# Patient Record
Sex: Male | Born: 2000
Health system: Southern US, Community
[De-identification: ages and names within clinical notes are randomized; demographics above are authoritative.]

## PROBLEM LIST (undated history)

## (undated) DIAGNOSIS — IMO0001 Reserved for inherently not codable concepts without codable children: Secondary | ICD-10-CM

## (undated) HISTORY — DX: Reserved for inherently not codable concepts without codable children: IMO0001

## (undated) HISTORY — PX: NO PAST SURGERIES: SHX2092

---
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
## 2019-07-17 ENCOUNTER — Ambulatory Visit (INDEPENDENT_AMBULATORY_CARE_PROVIDER_SITE_OTHER): Payer: 59 | Admitting: Physician Assistant

## 2019-07-17 ENCOUNTER — Other Ambulatory Visit: Payer: Self-pay

## 2019-07-17 ENCOUNTER — Encounter: Payer: Self-pay | Admitting: Physician Assistant

## 2019-07-17 VITALS — BP 118/76 | HR 71 | Temp 98.7°F | Resp 16 | Ht 74.0 in | Wt 181.4 lb

## 2019-07-17 DIAGNOSIS — F419 Anxiety disorder, unspecified: Secondary | ICD-10-CM | POA: Diagnosis not present

## 2019-07-17 DIAGNOSIS — F329 Major depressive disorder, single episode, unspecified: Secondary | ICD-10-CM

## 2019-07-17 DIAGNOSIS — Z0000 Encounter for general adult medical examination without abnormal findings: Secondary | ICD-10-CM

## 2019-07-17 DIAGNOSIS — F32A Depression, unspecified: Secondary | ICD-10-CM

## 2019-07-17 MED ORDER — ESCITALOPRAM OXALATE 10 MG PO TABS: 10.0000 mg | ORAL_TABLET | Freq: Every day | ORAL | 1 refills | Status: DC

## 2019-07-17 MED FILL — ESCITALOPRAM 10 MG TABLET: 10 | 30 days supply | Qty: 30 | Fill #0

## 2019-07-17 NOTE — Patient Instructions (Signed)
Please keep up with the mindfulness training and meditation.  This is a great avenue to help with stress, anxiety, focus and mood. It takes a while to notice the difference so hang in there!  I am starting you on a low-dose of Lexapro once daily to help with mood and anxiety.  Let's follow-up in 3-4 weeks via video visit to reassess how things are going.  Use the handout given to schedule a video counseling appointment.   Lean on your family, friends and girlfriend. There is nothing wrong with this.  Please schedule an appointment for fasting labs.    Preventive Care 50-66 Years Old, Male Preventive care refers to lifestyle choices and visits with your health care provider that can promote health and wellness. At this stage in your life, you may start seeing a primary care physician instead of a pediatrician. Your health care is now your responsibility. Preventive care for young adults includes:  A yearly physical exam. This is also called an annual wellness visit.  Regular dental and eye exams.  Immunizations.  Screening for certain conditions.  Healthy lifestyle choices, such as diet and exercise. What can I expect for my preventive care visit? Physical exam Your health care provider may check:  Height and weight. These may be used to calculate body mass index (BMI), which is a measurement that tells if you are at a healthy weight.  Heart rate and blood pressure.  Body temperature. Counseling Your health care provider may ask you questions about:  Past medical problems and family medical history.  Alcohol, tobacco, and drug use.  Home and relationship well-being.  Access to firearms.  Emotional well-being.  Diet, exercise, and sleep habits.  Sexual activity and sexual health. What immunizations do I need?  Influenza (flu) vaccine  This is recommended every year. Tetanus, diphtheria, and pertussis (Tdap) vaccine  You may need a Td booster every 10  years. Varicella (chickenpox) vaccine  You may need this vaccine if you have not already been vaccinated. Human papillomavirus (HPV) vaccine  If recommended by your health care provider, you may need three doses over 6 months. Measles, mumps, and rubella (MMR) vaccine  You may need at least one dose of MMR. You may also need a second dose. Meningococcal conjugate (MenACWY) vaccine  One dose is recommended if you are 61-63 years old and a Market researcher living in a residence hall, or if you have one of several medical conditions. You may also need additional booster doses. Pneumococcal conjugate (PCV13) vaccine  You may need this if you have certain conditions and were not previously vaccinated. Pneumococcal polysaccharide (PPSV23) vaccine  You may need one or two doses if you smoke cigarettes or if you have certain conditions. Hepatitis A vaccine  You may need this if you have certain conditions or if you travel or work in places where you may be exposed to hepatitis A. Hepatitis B vaccine  You may need this if you have certain conditions or if you travel or work in places where you may be exposed to hepatitis B. Haemophilus influenzae type b (Hib) vaccine  You may need this if you have certain risk factors. You may receive vaccines as individual doses or as more than one vaccine together in one shot (combination vaccines). Talk with your health care provider about the risks and benefits of combination vaccines. What tests do I need? Blood tests  Lipid and cholesterol levels. These may be checked every 5 years starting at age  20.  Hepatitis C test.  Hepatitis B test. Screening  Genital exam to check for testicular cancer or hernias.  Sexually transmitted disease (STD) testing, if you are at risk. Other tests  Tuberculosis skin test.  Vision and hearing tests.  Skin exam. Follow these instructions at home: Eating and drinking   Eat a diet that includes  fresh fruits and vegetables, whole grains, lean protein, and low-fat dairy products.  Drink enough fluid to keep your urine pale yellow.  Do not drink alcohol if: ? Your health care provider tells you not to drink. ? You are under the legal drinking age. In the [place] the legal drinking age is 34.  If you drink alcohol: ? Limit how much you have to 0-2 drinks a day. ? Be aware of how much alcohol is in your drink. In the [place] one drink equals one 12 oz bottle of beer (355 mL), one 5 oz glass of wine (148 mL), or one 1 oz glass of hard liquor (44 mL). Lifestyle  Take daily care of your teeth and gums.  Stay active. Exercise at least 30 minutes 5 or more days of the week.  Do not use any products that contain nicotine or tobacco, such as cigarettes, e-cigarettes, and chewing tobacco. If you need help quitting, ask your health care provider.  Do not use drugs.  If you are sexually active, practice safe sex. Use a condom or other form of protection to prevent STIs (sexually transmitted infections).  Find healthy ways to cope with stress, such as: ? Meditation, yoga, or listening to music. ? Journaling. ? Talking to a trusted person. ? Spending time with friends and family. Safety  Always wear your seat belt while driving or riding in a vehicle.  Do not drive if you have been drinking alcohol.  Do not ride with someone who has been drinking.  Do not drive when you are tired or distracted.  Do not text while driving.  Wear a helmet and other protective equipment during sports activities.  If you have firearms in your house, make sure you follow all gun safety procedures.  Seek help if you have been bullied, physically abused, or sexually abused.  Use the Internet responsibly to avoid dangers such as online bullying and online sex predators. What's next?  Go to your health care provider once a year for a well check visit.  Ask your health care provider how often you  should have your eyes and teeth checked.  Stay up to date on all vaccines. This information is not intended to replace advice given to you by your health care provider. Make sure you discuss any questions you have with your health care provider. Document Revised: 06/14/2018 Document Reviewed: 06/14/2018 Elsevier Patient Education  2020 [employer].

## 2019-07-17 NOTE — Progress Notes (Signed)
Patient presents to clinic today for annual exam.  Patient is not fasting for labs.  Acute Concerns: Patient endorsing issues with anxiety, depressed mood and past several months.  Initially mild but now worsening.  Notes this is associated with significant increase in stress levels.  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  Is sleeping okay at night.  Appetite stable.  Denies suicidal thought or ideation.  Mood is having a big impact on his day-to-day and ability to get things accomplished.  Has a girlfriend who he discussed his mood with.  Does feel that mom and dad would be a support system for him but does not want to "burden" them.  Has recently started reading on mindfulness for athlete's book.  Is starting to try meditation as well.  Health Maintenance: Immunizations -- Declines flu shot today.   Past Medical History:  Diagnosis Date  . Healthy adolescent     Past Surgical History:  Procedure Laterality Date  . NO PAST SURGERIES      No current outpatient medications on file prior to visit.   No current facility-administered medications on file prior to visit.    No Known Allergies  Family History  Problem Relation Age of Onset  . Hypertension Maternal Grandmother   . Hypertension Maternal Grandfather   . Cancer Paternal Grandfather        Lung  . Stroke Paternal Grandfather     Social History   Socioeconomic History  . Marital status: Single    Spouse name: Not on file  . Number of children: Not on file  . Years of education: Not on file  . Highest education level: Not on file  Occupational History  . Not on file  Tobacco Use  . Smoking status: Never Smoker  . Smokeless tobacco: Never Used  Substance and Sexual Activity  . Alcohol use: No  . Drug use: No  . Sexual activity: Yes    Partners: Female    Birth control/protection: Condom  Other Topics Concern  . Not on  file  Social History Narrative   ** Merged History Encounter **       Social Determinants of Health   Financial Resource Strain:   . Difficulty of Paying Living Expenses: Not on file  Food Insecurity:   . Worried About Programme researcher, broadcasting/film/video in the Last Year: Not on file  . Ran Out of Food in the Last Year: Not on file  Transportation Needs:   . Lack of Transportation (Medical): Not on file  . Lack of Transportation (Non-Medical): Not on file  Physical Activity:   . Days of Exercise per Week: Not on file  . Minutes of Exercise per Session: Not on file  Stress:   . Feeling of Stress : Not on file  Social Connections:   . Frequency of Communication with Friends and Family: Not on file  . Frequency of Social Gatherings with Friends and Family: Not on file  . Attends Religious Services: Not on file  . Active Member of Clubs or Organizations: Not on file  . Attends Banker Meetings: Not on file  . Marital Status: Not on file  Intimate Partner Violence:   . Fear of Current or Ex-Partner: Not on file  . Emotionally Abused: Not on file  . Physically Abused: Not on file  . Sexually Abused: Not on file   Review of Systems  Constitutional: Negative for fever and weight loss.  HENT: Negative for ear discharge, ear pain, hearing loss and tinnitus.   Eyes: Negative for blurred vision, double vision, photophobia and pain.  Respiratory: Negative for cough and shortness of breath.   Cardiovascular: Negative for chest pain and palpitations.  Gastrointestinal: Negative for abdominal pain, blood in stool, constipation, diarrhea, heartburn, melena, nausea and vomiting.  Genitourinary: Negative for dysuria, flank pain, frequency, hematuria and urgency.  Musculoskeletal: Negative for falls.  Neurological: Negative for dizziness, loss of consciousness and headaches.  Endo/Heme/Allergies: Negative for environmental allergies.  Psychiatric/Behavioral: Positive for depression. Negative for  hallucinations, substance abuse and suicidal ideas. The patient is nervous/anxious. The patient does not have insomnia.    BP 118/76   Pulse 71   Temp 98.7 F (37.1 C) (Temporal)   Resp 16   Ht 6\' 2"  (1.88 m)   Wt 181 lb 6.4 oz (82.3 kg)   SpO2 98%   BMI 23.29 kg/m   Physical Exam Vitals reviewed.  Constitutional:      General: He is not in acute distress.    Appearance: He is well-developed. He is not diaphoretic.  HENT:     Head: Normocephalic and atraumatic.     Right Ear: Tympanic membrane, ear canal and external ear normal.     Left Ear: Tympanic membrane, ear canal and external ear normal.     Nose: Nose normal.     Mouth/Throat:     Pharynx: No posterior oropharyngeal erythema.  Eyes:     Conjunctiva/sclera: Conjunctivae normal.     Pupils: Pupils are equal, round, and reactive to light.  Neck:     Thyroid: No thyromegaly.  Cardiovascular:     Rate and Rhythm: Normal rate and regular rhythm.     Heart sounds: Normal heart sounds.  Pulmonary:     Effort: Pulmonary effort is normal. No respiratory distress.     Breath sounds: Normal breath sounds. No wheezing or rales.  Chest:     Chest wall: No tenderness.  Abdominal:     General: Bowel sounds are normal. There is no distension.     Palpations: Abdomen is soft. There is no mass.     Tenderness: There is no abdominal tenderness. There is no guarding or rebound.  Musculoskeletal:     Cervical back: Neck supple.  Lymphadenopathy:     Cervical: No cervical adenopathy.  Skin:    General: Skin is warm and dry.     Findings: No rash.  Neurological:     Mental Status: He is alert and oriented to person, place, and time.     Cranial Nerves: No cranial nerve deficit.    Assessment/Plan: 1. Visit for preventive health examination Depression screen negative. Health Maintenance reviewed --declines flu shot. Preventive schedule discussed and handout given in AVS. Appointment for fasting labs scheduled.  Orders  placed in system.  2. Anxiety and depression Thankfully without alarm signs or symptoms.  Significant effect on quality of life at present.  Very impressed that he has started seeking out options to help with symptoms including mindfulness training and meditation.  Encouraged him to keep up with this.  Recommend counseling as well.  # for our counseling services given to patient.  After discussion we have decided to start low-dose SSRI.  We will begin Lexapro 10 mg once daily.  Video visit follow-up in 4 weeks.  Return sooner if needed.  This visit occurred during the SARS-CoV-2 public health emergency.  Safety protocols  were in place, including screening questions prior to the visit, additional usage of staff PPE, and extensive cleaning of exam room while observing appropriate contact time as indicated for disinfecting solutions.      Leeanne Rio, PA-C

## 2019-07-18 ENCOUNTER — Ambulatory Visit (INDEPENDENT_AMBULATORY_CARE_PROVIDER_SITE_OTHER): Payer: 59 | Admitting: Emergency Medicine

## 2019-07-18 DIAGNOSIS — Z0000 Encounter for general adult medical examination without abnormal findings: Secondary | ICD-10-CM

## 2019-07-18 LAB — CBC WITH DIFFERENTIAL/PLATELET
Basophils Absolute: 0 10*3/uL (ref 0.0–0.1)
Basophils Relative: 0.3 % (ref 0.0–3.0)
Eosinophils Absolute: 0 10*3/uL (ref 0.0–0.7)
Eosinophils Relative: 0.5 % (ref 0.0–5.0)
HCT: 45 % (ref 36.0–49.0)
Hemoglobin: 15 g/dL (ref 12.0–16.0)
Lymphocytes Relative: 33.1 % (ref 24.0–48.0)
Lymphs Abs: 1.7 10*3/uL (ref 0.7–4.0)
MCHC: 33.3 g/dL (ref 31.0–37.0)
MCV: 84.4 fl (ref 78.0–98.0)
Monocytes Absolute: 0.5 10*3/uL (ref 0.1–1.0)
Monocytes Relative: 9.9 % (ref 3.0–12.0)
Neutro Abs: 2.8 10*3/uL (ref 1.4–7.7)
Neutrophils Relative %: 56.2 % (ref 43.0–71.0)
Platelets: 208 10*3/uL (ref 150.0–575.0)
RBC: 5.33 Mil/uL (ref 3.80–5.70)
RDW: 13.4 % (ref 11.4–15.5)
WBC: 5 10*3/uL (ref 4.5–13.5)

## 2019-07-18 LAB — COMPREHENSIVE METABOLIC PANEL
ALT: 15 U/L (ref 0–53)
AST: 24 U/L (ref 0–37)
Albumin: 4.7 g/dL (ref 3.5–5.2)
Alkaline Phosphatase: 73 U/L (ref 52–171)
BUN: 17 mg/dL (ref 6–23)
CO2: 29 mEq/L (ref 19–32)
Calcium: 9.6 mg/dL (ref 8.4–10.5)
Chloride: 102 mEq/L (ref 96–112)
Creatinine, Ser: 1.16 mg/dL (ref 0.40–1.50)
GFR: 97.43 mL/min (ref >60.00)
Glucose, Bld: 94 mg/dL (ref 70–99)
Potassium: 4.1 mEq/L (ref 3.5–5.1)
Sodium: 139 mEq/L (ref 135–145)
Total Bilirubin: 0.7 mg/dL (ref 0.2–1.2)
Total Protein: 7.4 g/dL (ref 6.0–8.3)

## 2019-07-18 LAB — HEMOGLOBIN A1C: Hgb A1c MFr Bld: 5.5 % (ref 4.6–6.5)

## 2019-07-19 ENCOUNTER — Other Ambulatory Visit (INDEPENDENT_AMBULATORY_CARE_PROVIDER_SITE_OTHER): Payer: 59

## 2019-07-19 DIAGNOSIS — Z0000 Encounter for general adult medical examination without abnormal findings: Secondary | ICD-10-CM

## 2019-07-19 LAB — LIPID PANEL
Cholesterol: 150 mg/dL (ref 0–200)
HDL: 52.3 mg/dL (ref >39.00)
LDL Cholesterol: 91 mg/dL (ref 0–99)
NonHDL: 97.81
Total CHOL/HDL Ratio: 3
Triglycerides: 36 mg/dL (ref 0.0–149.0)
VLDL: 7.2 mg/dL (ref 0.0–40.0)

## 2019-07-23 ENCOUNTER — Encounter: Payer: Self-pay | Admitting: Emergency Medicine

## 2020-04-25 IMAGING — US US SCROTUM
1 series · 14 of 25 positions shown · non-contrast
Comparison: None.

CLINICAL DATA: Initial evaluation for intermittent left-sided
scrotal swelling. History of treated epididymitis.

EXAM:
SCROTAL ULTRASOUND
DOPPLER ULTRASOUND OF THE TESTICLES
TECHNIQUE: Complete ultrasound examination of the testicles, epididymis, and
other scrotal structures was performed. Color and spectral Doppler
ultrasound were also utilized to evaluate blood flow to the
testicles.

[Series 1: us scrotum · 0.07mm/px · 14 of 79 slices shown]
[im 1/79]
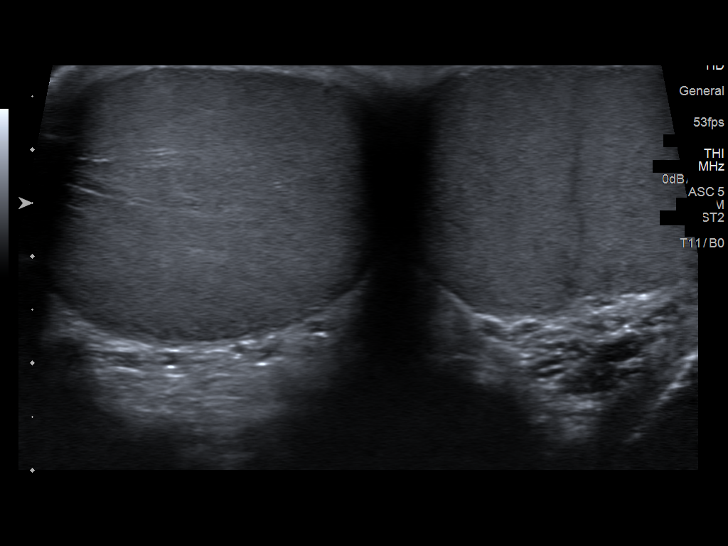
[im 7/79]
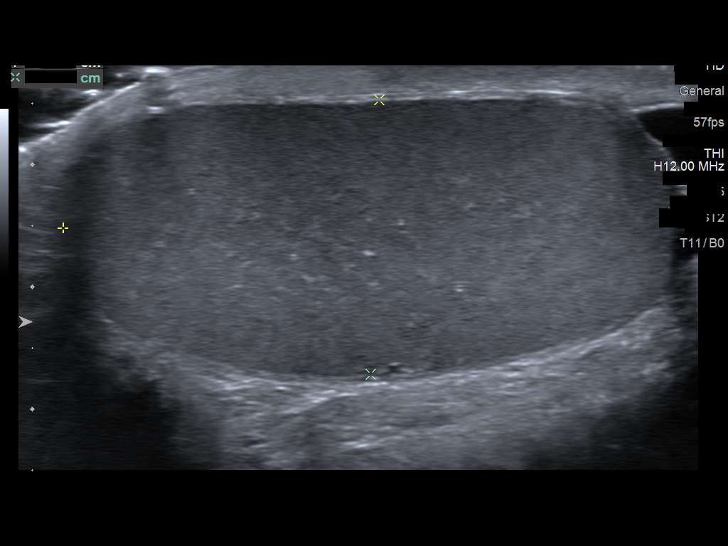
[im 14/79]
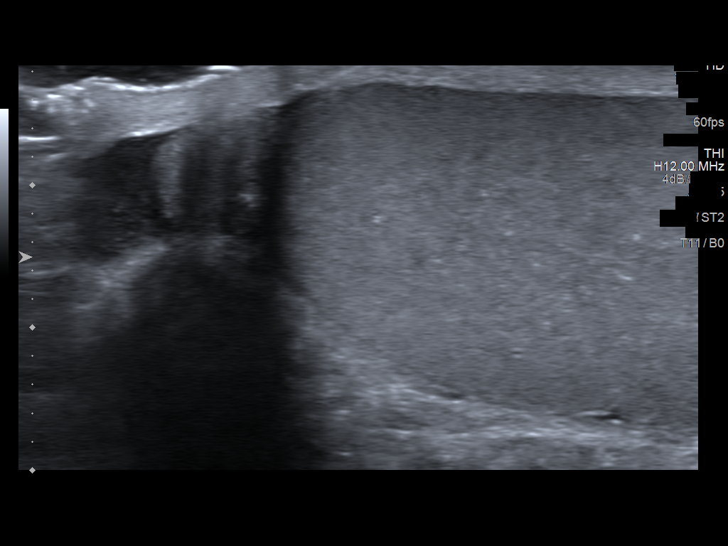
[im 20/79]
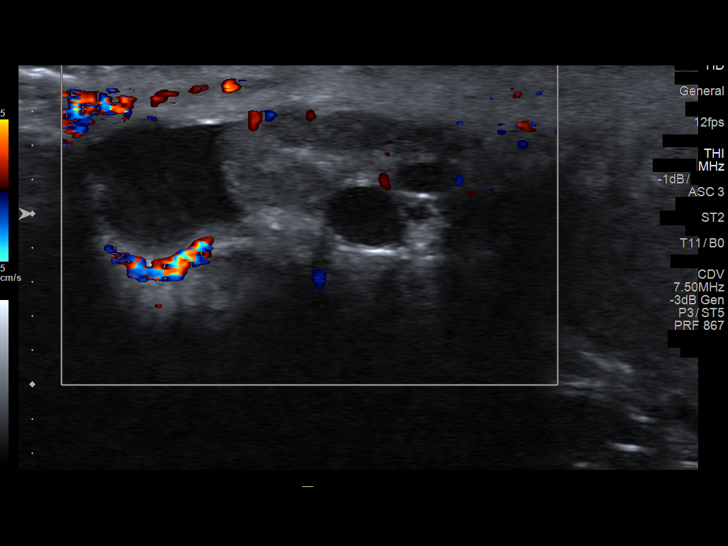
[im 27/79]
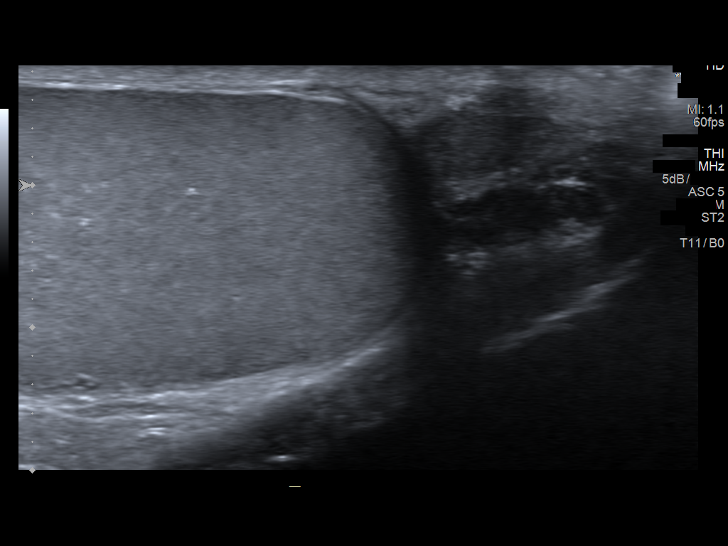
[im 30/79]
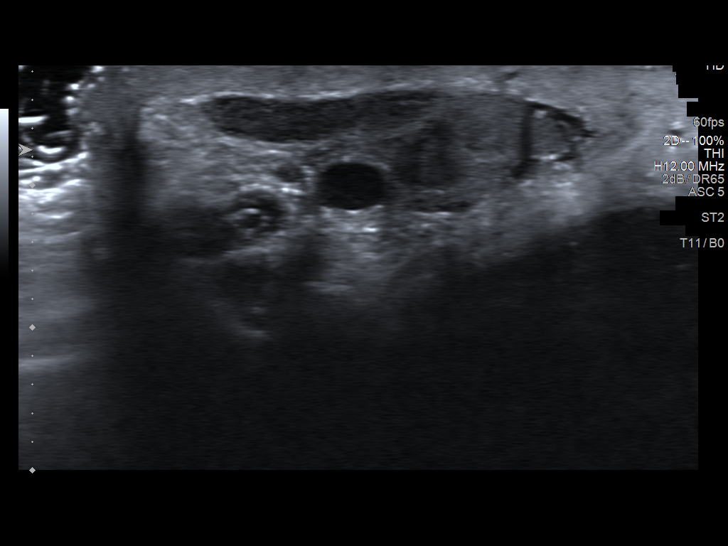
[im 36/79]
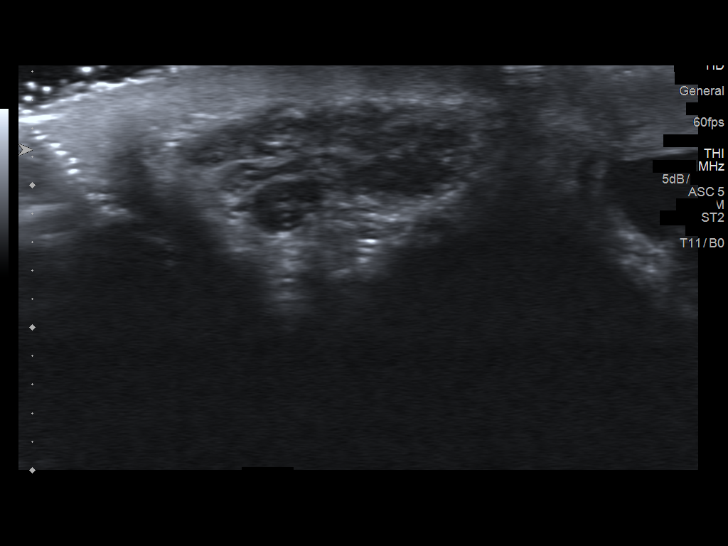
[im 43/79]
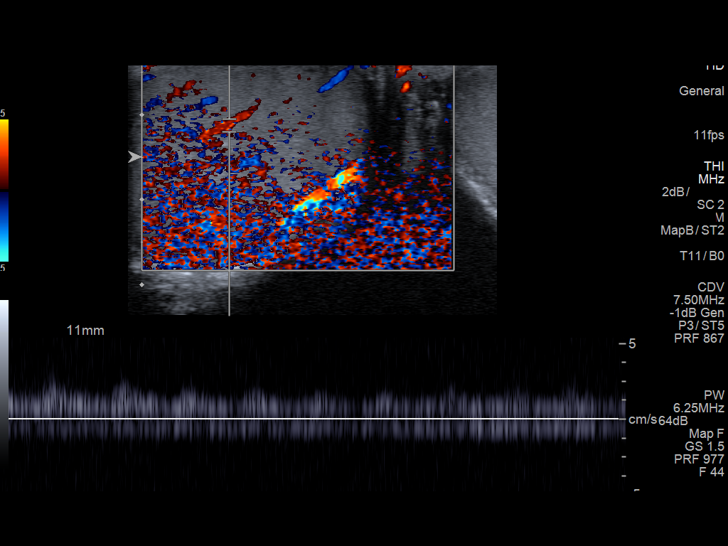
[im 49/79]
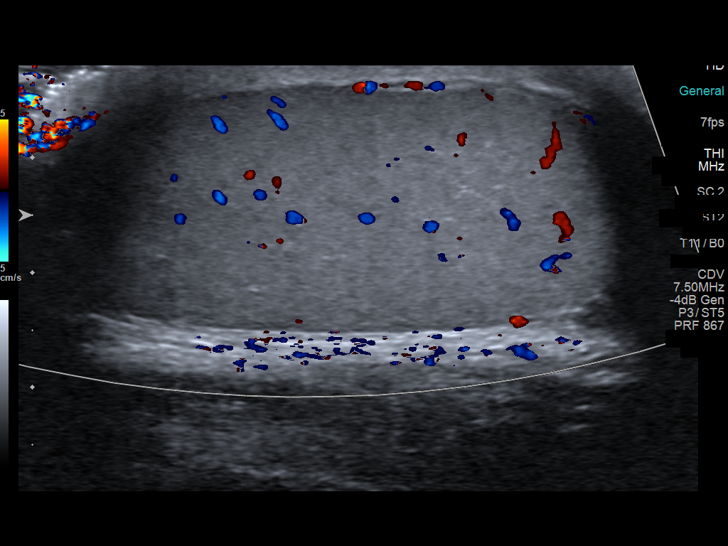
[im 53/79]
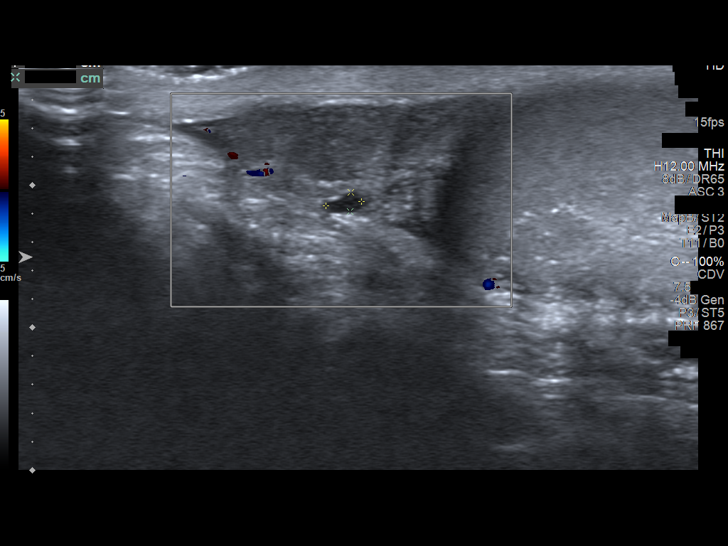
[im 59/79]
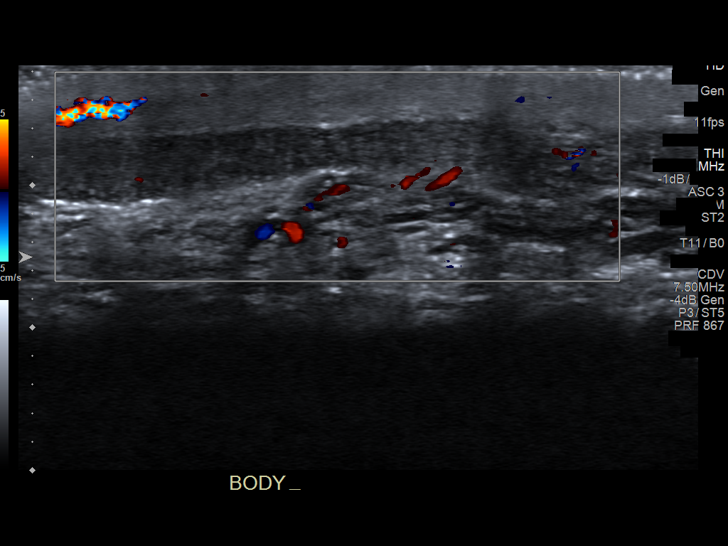
[im 66/79]
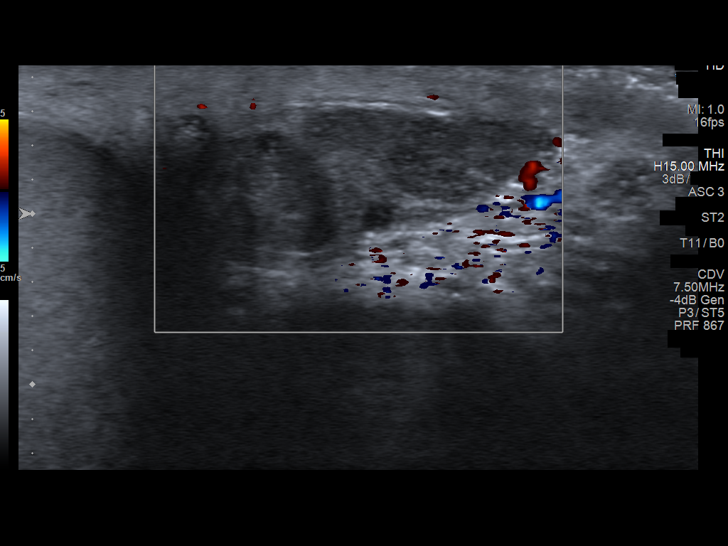
[im 72/79]
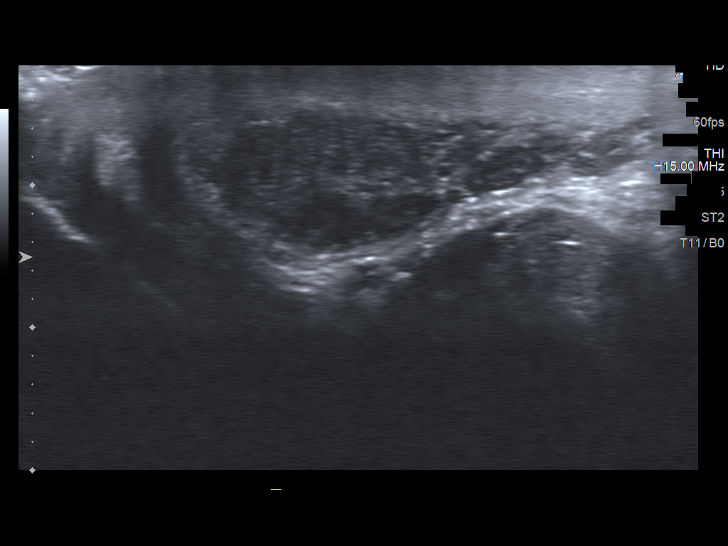
[im 79/79]
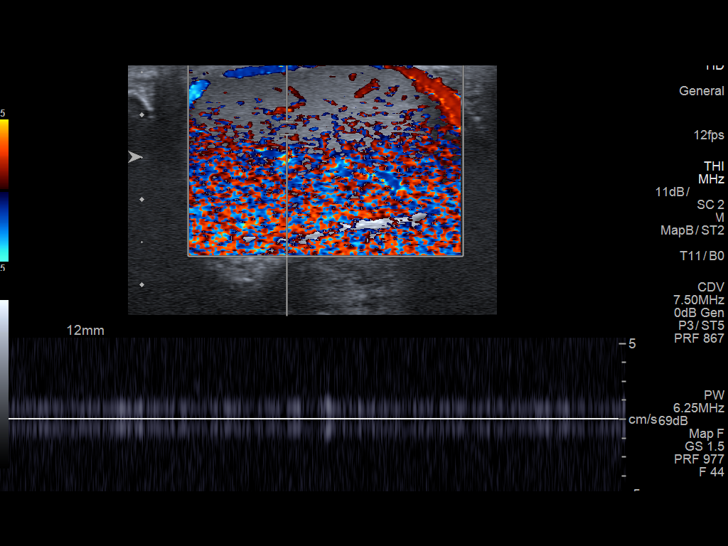

[14 of 25 positions shown; findings below may reference images not displayed]

FINDINGS: Right testicle

Measurements: 5.0 x 2.3 x 3.2 cm. No mass or microlithiasis
visualized.

Left testicle

Measurements: 5.2 x 2.0 x 3.0 cm. No mass or microlithiasis
visualized.

Right epididymis: Normal in size and appearance. Few small cysts
measuring less than 5 mm present at the right epididymal head, of
doubtful significance.

Left epididymis: Normal in size and appearance. Few small cysts
measuring less than 3 mm present at the left epididymal head, of
doubtful significance.

Hydrocele:  None visualized.

Varicocele:  None visualized.

Pulsed Doppler interrogation of both testes demonstrates normal low
resistance arterial and venous waveforms bilaterally.
IMPRESSION: Normal scrotal ultrasound.

## 2020-04-28 ENCOUNTER — Telehealth: Payer: 59 | Admitting: Physician Assistant

## 2020-09-25 DIAGNOSIS — M79641 Pain in right hand: Secondary | ICD-10-CM | POA: Diagnosis not present

## 2020-12-08 DIAGNOSIS — M24541 Contracture, right hand: Secondary | ICD-10-CM | POA: Diagnosis not present

## 2020-12-16 DIAGNOSIS — S63614D Unspecified sprain of right ring finger, subsequent encounter: Secondary | ICD-10-CM | POA: Diagnosis not present

## 2020-12-16 DIAGNOSIS — M25641 Stiffness of right hand, not elsewhere classified: Secondary | ICD-10-CM | POA: Diagnosis not present

## 2020-12-22 DIAGNOSIS — M25641 Stiffness of right hand, not elsewhere classified: Secondary | ICD-10-CM | POA: Diagnosis not present

## 2020-12-22 DIAGNOSIS — S63614D Unspecified sprain of right ring finger, subsequent encounter: Secondary | ICD-10-CM | POA: Diagnosis not present

## 2020-12-24 DIAGNOSIS — S63614D Unspecified sprain of right ring finger, subsequent encounter: Secondary | ICD-10-CM | POA: Diagnosis not present

## 2020-12-24 DIAGNOSIS — M25641 Stiffness of right hand, not elsewhere classified: Secondary | ICD-10-CM | POA: Diagnosis not present

## 2021-01-07 DIAGNOSIS — Z025 Encounter for examination for participation in sport: Secondary | ICD-10-CM | POA: Diagnosis not present

## 2021-01-07 DIAGNOSIS — Z130 Encounter for screening for diseases of the blood and blood-forming organs and certain disorders involving the immune mechanism: Secondary | ICD-10-CM | POA: Diagnosis not present

## 2021-07-20 ENCOUNTER — Other Ambulatory Visit (HOSPITAL_BASED_OUTPATIENT_CLINIC_OR_DEPARTMENT_OTHER): Payer: Self-pay

## 2021-07-20 DIAGNOSIS — F900 Attention-deficit hyperactivity disorder, predominantly inattentive type: Secondary | ICD-10-CM | POA: Diagnosis not present

## 2021-07-20 MED ORDER — ATOMOXETINE HCL 25 MG PO CAPS
ORAL_CAPSULE | ORAL | 0 refills | Status: DC
  Filled 2021-07-20: qty 30, 30d supply, fill #0

## 2022-02-02 ENCOUNTER — Other Ambulatory Visit: Payer: 59

## 2022-02-02 ENCOUNTER — Ambulatory Visit: Payer: 59 | Admitting: Physician Assistant

## 2022-02-02 ENCOUNTER — Other Ambulatory Visit (HOSPITAL_BASED_OUTPATIENT_CLINIC_OR_DEPARTMENT_OTHER): Payer: Self-pay

## 2022-02-02 ENCOUNTER — Encounter: Payer: Self-pay | Admitting: Physician Assistant

## 2022-02-02 ENCOUNTER — Other Ambulatory Visit: Payer: Self-pay

## 2022-02-02 ENCOUNTER — Ambulatory Visit (HOSPITAL_COMMUNITY): Payer: 59 | Attending: Physician Assistant

## 2022-02-02 VITALS — BP 129/65 | HR 65 | Resp 18 | Ht 74.0 in | Wt 198.0 lb

## 2022-02-02 DIAGNOSIS — Z0000 Encounter for general adult medical examination without abnormal findings: Secondary | ICD-10-CM

## 2022-02-02 DIAGNOSIS — Z7251 High risk heterosexual behavior: Secondary | ICD-10-CM

## 2022-02-02 DIAGNOSIS — R079 Chest pain, unspecified: Secondary | ICD-10-CM | POA: Diagnosis not present

## 2022-02-02 DIAGNOSIS — R001 Bradycardia, unspecified: Secondary | ICD-10-CM | POA: Diagnosis not present

## 2022-02-02 DIAGNOSIS — Z0001 Encounter for general adult medical examination with abnormal findings: Secondary | ICD-10-CM

## 2022-02-02 DIAGNOSIS — R9431 Abnormal electrocardiogram [ECG] [EKG]: Secondary | ICD-10-CM | POA: Diagnosis not present

## 2022-02-02 DIAGNOSIS — R369 Urethral discharge, unspecified: Secondary | ICD-10-CM | POA: Diagnosis not present

## 2022-02-02 MED ORDER — DOXYCYCLINE HYCLATE 100 MG PO CAPS
100.0000 mg | ORAL_CAPSULE | Freq: Two times a day (BID) | ORAL | 0 refills | Status: DC
  Filled 2022-02-02: qty 20, 10d supply, fill #0

## 2022-02-02 NOTE — Progress Notes (Unsigned)
   New Patient Office Visit  Subjective    Patient ID: Kenneth Russo, male    DOB: [birth year]  Age: 21 y.o. MRN: [id number]  CC: No chief complaint on file.   HPI Kenneth Russo presents to establish care ***  EKG does show some diffuse nonspecific ST changes  Unprotected sex  Discharge - two weeks ago - clear discharge;       Outpatient Encounter Medications as of 02/02/2022  Medication Sig   atomoxetine (STRATTERA) 25 MG capsule Take 1 tablet by mouth each morning   escitalopram (LEXAPRO) 10 MG tablet Take 1 tablet (10 mg total) by mouth daily.   No facility-administered encounter medications on file as of 02/02/2022.    Past Medical History:  Diagnosis Date   Healthy adolescent     Past Surgical History:  Procedure Laterality Date   NO PAST SURGERIES      Family History  Problem Relation Age of Onset   Hypertension Maternal Grandmother    Hypertension Maternal Grandfather    Cancer Paternal Grandfather        Lung   Stroke Paternal Grandfather     Social History   Socioeconomic History   Marital status: Single    Spouse name: Not on file   Number of children: Not on file   Years of education: Not on file   Highest education level: Not on file  Occupational History   Not on file  Tobacco Use   Smoking status: Never   Smokeless tobacco: Never  Vaping Use   Vaping Use: Never used  Substance and Sexual Activity   Alcohol use: No   Drug use: No   Sexual activity: Yes    Partners: Female    Birth control/protection: Condom  Other Topics Concern   Not on file  Social History Narrative   ** Merged History Encounter **       Social Determinants of Health   Financial Resource Strain: Not on file  Food Insecurity: Not on file  Transportation Needs: Not on file  Physical Activity: Not on file  Stress: Not on file  Social Connections: Not on file  Intimate Partner Violence: Not on file    ROS      Objective    There were no vitals  taken for this visit.  Physical Exam  {Labs (Optional):23779}    Assessment & Plan:   Problem List Items Addressed This Visit   None   No follow-ups on file.   Kasandra Knudsen Mayers, PA-C

## 2022-02-02 NOTE — Patient Instructions (Signed)
You are going to take doxycycline twice a day for 10 days.  We will call you with your lab results and EKG results when they are available.  Please let us know if there is anything else we can do for you  Roney Jaffe, PA-C Physician Assistant [facility] [email or web address]   Safe Sex Practicing safe sex means taking steps before and during sex to reduce your risk of: Getting an STI (sexually transmitted infection). Giving your partner an STI. Unwanted or unplanned pregnancy. How to practice safe sex Ways you can practice safe sex  Limit your sexual partners to only one partner who is having sex with only you. Avoid using alcohol and drugs before having sex. Alcohol and drugs can affect your judgment. Before having sex with a new partner: Talk to your partner about past partners, past STIs, and drug use. Get screened for STIs and discuss the results with your partner. Ask your partner to get screened too. Check your body regularly for sores, blisters, rashes, or unusual discharge. If you notice any of these problems, visit your health care provider. Avoid sexual contact if you have symptoms of an infection or you are being treated for an STI. While having sex, use a condom. Make sure to: Use a condom every time you have vaginal, oral, or anal sex. Both females and males should wear condoms during oral sex. Keep condoms in place from the beginning to the end of sexual activity. Use a latex condom, if possible. Latex condoms offer the best protection. Use only water-based lubricants with a condom. Using petroleum-based lubricants or oils will weaken the condom and increase the chance that it will break. Ways your health care provider can help you practice safe sex  See your health care provider for regular screenings, exams, and tests for STIs. Talk with your health care provider about what kind of birth control (contraception)  is best for you. Get vaccinated against hepatitis B and human papillomavirus (HPV). If you are at risk of being infected with HIV (human immunodeficiency virus), talk with your health care provider about taking a prescription medicine to prevent HIV infection. You are at risk for HIV if you: Are a man who has sex with other men. Are sexually active with more than one partner. Take drugs by injection. Have a sex partner who has HIV. Have unprotected sex. Have sex with someone who has sex with both men and women. Have had an STI. Follow these instructions at home: Take over-the-counter and prescription medicines only as told by your health care provider. Keep all follow-up visits. This is important. Where to find more information Centers for Disease Control and Prevention: FootballExhibition.com.br Planned Parenthood: www.plannedparenthood.org Office on [employer] [facility]: http://hoffman.com/ Summary Practicing safe sex means taking steps before and during sex to reduce your risk getting an STI, giving your partner an STI, and having an unwanted or unplanned pregnancy. Before having sex with a new partner, talk to your partner about past partners, past STIs, and drug use. Use a condom every time you have vaginal, oral, or anal sex. Both females and males should wear condoms during oral sex. Check your body regularly for sores, blisters, rashes, or unusual discharge. If you notice any of these problems, visit your health care provider. See your health care provider for regular screenings, exams, and tests for STIs. This information is not intended to replace advice given to you by your health care provider. Make sure you discuss any questions you have  with your health care provider. Document Revised: 11/25/2019 Document Reviewed: 11/25/2019 Elsevier Patient Education  2023 [employer].

## 2022-02-03 ENCOUNTER — Other Ambulatory Visit: Payer: Self-pay

## 2022-02-03 ENCOUNTER — Other Ambulatory Visit (HOSPITAL_COMMUNITY)
Admission: RE | Admit: 2022-02-03 | Discharge: 2022-02-03 | Disposition: A | Discharge status: Home / Self Care | Payer: 59 | Source: Ambulatory Visit | Attending: Physician Assistant | Admitting: Physician Assistant

## 2022-02-03 DIAGNOSIS — R369 Urethral discharge, unspecified: Secondary | ICD-10-CM | POA: Diagnosis not present

## 2022-02-03 LAB — RPR: RPR Ser Ql: NONREACTIVE

## 2022-02-03 LAB — HIV ANTIBODY (ROUTINE TESTING W REFLEX): HIV Screen 4th Generation wRfx: NONREACTIVE

## 2022-02-04 LAB — URINE CYTOLOGY ANCILLARY ONLY
Chlamydia: NEGATIVE
Comment: NEGATIVE
Comment: NEGATIVE
Comment: NORMAL
Neisseria Gonorrhea: NEGATIVE
Trichomonas: NEGATIVE

## 2023-01-19 ENCOUNTER — Telehealth: Payer: Self-pay | Admitting: Nurse Practitioner

## 2023-01-19 ENCOUNTER — Ambulatory Visit: Payer: BC Managed Care – PPO | Admitting: Nurse Practitioner

## 2023-01-19 NOTE — Telephone Encounter (Signed)
[id number] new pt no show / no letter sent but pt block for future scheldule

## 2023-03-03 ENCOUNTER — Encounter (INDEPENDENT_AMBULATORY_CARE_PROVIDER_SITE_OTHER): Payer: Self-pay | Admitting: Primary Care

## 2023-03-03 ENCOUNTER — Ambulatory Visit: Payer: BC Managed Care – PPO

## 2023-03-03 ENCOUNTER — Other Ambulatory Visit (HOSPITAL_COMMUNITY)
Admission: RE | Admit: 2023-03-03 | Discharge: 2023-03-03 | Disposition: A | Discharge status: Home / Self Care | Payer: Commercial Managed Care - PPO | Source: Ambulatory Visit | Attending: Primary Care | Admitting: Primary Care

## 2023-03-03 ENCOUNTER — Ambulatory Visit (INDEPENDENT_AMBULATORY_CARE_PROVIDER_SITE_OTHER): Payer: Commercial Managed Care - PPO | Admitting: Primary Care

## 2023-03-03 VITALS — BP 120/77 | HR 64 | Resp 16 | Ht 74.0 in | Wt 175.6 lb

## 2023-03-03 DIAGNOSIS — Z113 Encounter for screening for infections with a predominantly sexual mode of transmission: Secondary | ICD-10-CM

## 2023-03-03 DIAGNOSIS — Z114 Encounter for screening for human immunodeficiency virus [HIV]: Secondary | ICD-10-CM | POA: Diagnosis not present

## 2023-03-03 DIAGNOSIS — Z1159 Encounter for screening for other viral diseases: Secondary | ICD-10-CM

## 2023-03-03 DIAGNOSIS — Z0000 Encounter for general adult medical examination without abnormal findings: Secondary | ICD-10-CM | POA: Diagnosis not present

## 2023-03-04 LAB — LIPID PANEL
Chol/HDL Ratio: 3.5 ratio (ref 0.0–5.0)
Cholesterol, Total: 172 mg/dL (ref 100–199)
HDL: 49 mg/dL (ref >39)
LDL Chol Calc (NIH): 114 mg/dL — ABNORMAL HIGH (ref 0–99)
Triglycerides: 46 mg/dL (ref 0–149)
VLDL Cholesterol Cal: 9 mg/dL (ref 5–40)

## 2023-03-04 LAB — CMP14+EGFR
ALT: 14 IU/L (ref 0–44)
AST: 17 IU/L (ref 0–40)
Albumin: 4.7 g/dL (ref 4.3–5.2)
Alkaline Phosphatase: 61 IU/L (ref 44–121)
BUN/Creatinine Ratio: 14 (ref 9–20)
BUN: 17 mg/dL (ref 6–20)
Bilirubin Total: 0.7 mg/dL (ref 0.0–1.2)
CO2: 27 mmol/L (ref 20–29)
Calcium: 9.4 mg/dL (ref 8.7–10.2)
Chloride: 103 mmol/L (ref 96–106)
Creatinine, Ser: 1.25 mg/dL (ref 0.76–1.27)
Globulin, Total: 2.6 g/dL (ref 1.5–4.5)
Glucose: 87 mg/dL (ref 70–99)
Potassium: 4 mmol/L (ref 3.5–5.2)
Sodium: 141 mmol/L (ref 134–144)
Total Protein: 7.3 g/dL (ref 6.0–8.5)
eGFR: 83 mL/min/{1.73_m2} (ref >59)

## 2023-03-04 LAB — CBC WITH DIFFERENTIAL/PLATELET
Basophils Absolute: 0 10*3/uL (ref 0.0–0.2)
Basos: 1 %
EOS (ABSOLUTE): 0 10*3/uL (ref 0.0–0.4)
Eos: 1 %
Hematocrit: 45.2 % (ref 37.5–51.0)
Hemoglobin: 14.7 g/dL (ref 13.0–17.7)
Immature Grans (Abs): 0 10*3/uL (ref 0.0–0.1)
Immature Granulocytes: 0 %
Lymphocytes Absolute: 1.9 10*3/uL (ref 0.7–3.1)
Lymphs: 47 %
MCH: 27.9 pg (ref 26.6–33.0)
MCHC: 32.5 g/dL (ref 31.5–35.7)
MCV: 86 fL (ref 79–97)
Monocytes Absolute: 0.4 10*3/uL (ref 0.1–0.9)
Monocytes: 11 %
Neutrophils Absolute: 1.6 10*3/uL (ref 1.4–7.0)
Neutrophils: 40 %
Platelets: 242 10*3/uL (ref 150–450)
RBC: 5.27 x10E6/uL (ref 4.14–5.80)
RDW: 12.6 % (ref 11.6–15.4)
WBC: 4 10*3/uL (ref 3.4–10.8)

## 2023-03-04 LAB — HCV AB W REFLEX TO QUANT PCR: HCV Ab: NONREACTIVE

## 2023-03-04 LAB — HEMOGLOBIN A1C
Est. average glucose Bld gHb Est-mCnc: 117 mg/dL
Hgb A1c MFr Bld: 5.7 % — ABNORMAL HIGH (ref 4.8–5.6)

## 2023-03-04 LAB — HCV INTERPRETATION

## 2023-03-04 LAB — HIV ANTIBODY (ROUTINE TESTING W REFLEX): HIV Screen 4th Generation wRfx: NONREACTIVE

## 2023-03-04 NOTE — Progress Notes (Signed)
[facility]  [name] is a 22 y.o. male presents to office today for annual physical exam examination. He is sexually active but not at the moment . He does use .condom.  Concerns today include: 1. none Occupation: school , Marital status: s,  Health Maintenance  Topic Date Due   COVID-19 Vaccine (1 - 2023-24 season) Never done   DTaP/Tdap/Td (6 - Td or Tdap) 04/25/2022   INFLUENZA VACCINE  10/02/2023 (Originally 02/02/2023)   HPV VACCINES  Completed   Hepatitis C Screening  Completed   HIV Screening  Completed     Past Medical History:  Diagnosis Date   Healthy adolescent    Social History   Socioeconomic History   Marital status: Single    Spouse name: Not on file   Number of children: Not on file   Years of education: Not on file   Highest education level: Not on file  Occupational History   Not on file  Tobacco Use   Smoking status: Never   Smokeless tobacco: Never  Vaping Use   Vaping status: Never Used  Substance and Sexual Activity   Alcohol use: No   Drug use: No   Sexual activity: Yes    Partners: Female    Birth control/protection: Condom  Other Topics Concern   Not on file  Social History Narrative   ** Merged History Encounter **       Social Determinants of Health   Financial Resource Strain: Not on file  Food Insecurity: Not on file  Transportation Needs: Not on file  Physical Activity: Not on file  Stress: Not on file  Social Connections: Not on file  Intimate Partner Violence: Not on file   Past Surgical History:  Procedure Laterality Date   NO PAST SURGERIES     Family History  Problem Relation Age of Onset   Hypertension Maternal Grandmother    Hypertension Maternal Grandfather    Cancer Paternal Grandfather        Lung   Stroke Paternal Grandfather     Current Outpatient Medications:    atomoxetine (STRATTERA) 25 MG capsule, Take 1 tablet by mouth each morning (Patient not taking: Reported on  03/03/2023), Disp: 30 capsule, Rfl: 0   doxycycline (VIBRAMYCIN) 100 MG capsule, Take 1 capsule (100 mg total) by mouth 2 (two) times daily. (Patient not taking: Reported on 03/03/2023), Disp: 20 capsule, Rfl: 0   escitalopram (LEXAPRO) 10 MG tablet, Take 1 tablet (10 mg total) by mouth daily. (Patient not taking: Reported on 03/03/2023), Disp: 30 tablet, Rfl: 1 Outpatient Encounter Medications as of 03/03/2023  Medication Sig   atomoxetine (STRATTERA) 25 MG capsule Take 1 tablet by mouth each morning (Patient not taking: Reported on 03/03/2023)   doxycycline (VIBRAMYCIN) 100 MG capsule Take 1 capsule (100 mg total) by mouth 2 (two) times daily. (Patient not taking: Reported on 03/03/2023)   escitalopram (LEXAPRO) 10 MG tablet Take 1 tablet (10 mg total) by mouth daily. (Patient not taking: Reported on 03/03/2023)   No facility-administered encounter medications on file as of 03/03/2023.    No Known Allergies   ROS: Review of Systems Pertinent items noted in HPI and remainder of comprehensive ROS otherwise negative.    Physical exam: General: Vital signs reviewed.  Patient is well-developed and well-nourished, xx in no acute distress and cooperative with exam. Head: Normocephalic and atraumatic. Eyes: EOMI, conjunctivae normal, no scleral icterus. Neck: Supple, trachea midline, normal ROM, no JVD, masses, thyromegaly, or  carotid bruit present. Cardiovascular: RRR, S1 normal, S2 normal, no murmurs, gallops, or rubs. Pulmonary/Chest: Clear to auscultation bilaterally, no wheezes, rales, or rhonchi. Abdominal: Soft, non-tender, non-distended, BS +, no masses, organomegaly, or guarding present. Musculoskeletal: No joint deformities, erythema, or stiffness, ROM full and nontender. Extremities: No lower extremity edema bilaterally,  pulses symmetric and intact bilaterally. No cyanosis or clubbing. Neurological: A&O x3, Strength is normal Skin: Warm, dry and intact. No rashes or erythema. Psychiatric:  Normal mood and affect. speech and behavior is normal. Cognition and memory are normal.   Assessment/ Plan: Kenneth Russo here for annual physical exam.   Kenneth Russo was seen today for annual exam.  Diagnoses and all orders for this visit:  Screening examination for STD (sexually transmitted disease) -     Cancel: Urine cytology ancillary only -     HIV Antibody (routine testing w rflx) -     Urine cytology ancillary only  Need for hepatitis C screening test -     HCV Ab w Reflex to Quant PCR  Screening for HIV (human immunodeficiency virus) -     HIV Antibody (routine testing w rflx)  Annual physical exam -     CBC with Differential/Platelet -     CMP14+EGFR -     Hemoglobin A1c -     Lipid panel  Other orders -     Interpretation:  Counseled on healthy lifestyle choices, including diet (rich in fruits, vegetables and lean meats and low in salt and simple carbohydrates) and exercise (at least 30 minutes of moderate physical activity daily).  Patient to follow up in 1 year for annual exam or sooner if needed.  The above assessment and management plan was discussed with the patient. The patient verbalized understanding of and has agreed to the management plan. Patient is aware to call the clinic if symptoms persist or worsen. Patient is aware when to return to the clinic for a follow-up visit. Patient educated on when it is appropriate to go to the emergency department.   This note has been created with Education officer, environmental. Any transcriptional errors are unintentional.   Grayce Sessions, NP 03/04/2023, 9:04 PM

## 2023-03-09 LAB — URINE CYTOLOGY ANCILLARY ONLY
Chlamydia: NEGATIVE
Comment: NEGATIVE
Comment: NEGATIVE
Comment: NORMAL
Neisseria Gonorrhea: NEGATIVE
Trichomonas: NEGATIVE

## 2024-04-02 ENCOUNTER — Other Ambulatory Visit (HOSPITAL_COMMUNITY)
Admission: RE | Admit: 2024-04-02 | Discharge: 2024-04-02 | Disposition: A | Discharge status: Home / Self Care | Source: Ambulatory Visit | Attending: Physician Assistant | Admitting: Physician Assistant

## 2024-04-02 ENCOUNTER — Ambulatory Visit: Admitting: Physician Assistant

## 2024-04-02 ENCOUNTER — Other Ambulatory Visit: Payer: Self-pay | Admitting: Physician Assistant

## 2024-04-02 ENCOUNTER — Encounter: Payer: Self-pay | Admitting: Physician Assistant

## 2024-04-02 VITALS — BP 116/76 | HR 63 | Ht 74.0 in | Wt 185.0 lb

## 2024-04-02 DIAGNOSIS — Z113 Encounter for screening for infections with a predominantly sexual mode of transmission: Secondary | ICD-10-CM | POA: Insufficient documentation

## 2024-04-02 NOTE — Progress Notes (Unsigned)
   Established Patient Office Visit  Subjective   Patient ID: Kenneth Russo, male    DOB: 2001-04-03  Age: 23 y.o. MRN: [id number]  No chief complaint on file.   HPI  {History (Optional):23778}  ROS    Objective:     There were no vitals taken for this visit. {Vitals History (Optional):23777}  Physical Exam   No results found for any visits on 04/02/24.  {Labs (Optional):23779}  The ASCVD Risk score (Arnett DK, et al., 2019) failed to calculate for the following reasons:   The 2019 ASCVD risk score is only valid for ages 54 to 53    Assessment & Plan:   Problem List Items Addressed This Visit   None   No follow-ups on file.    Kirk RAMAN Mayers, PA-C

## 2024-04-02 NOTE — Patient Instructions (Signed)
 VISIT SUMMARY:  Today, you came in for sexually transmitted infection (STI) screenings. You do not have any symptoms or known exposures to STIs, but you requested testing for herpes due to a past sore that resembled a pimple.   YOUR PLAN:  -SCREENING FOR SEXUALLY TRANSMITTED INFECTIONS: Screening for sexually transmitted infections involves testing to check for the presence of infections that can be transmitted through sexual contact. Since you have no symptoms or known exposures, we will conduct a series of tests to ensure your health. We have ordered a urine test for gonorrhea, chlamydia, and trichomonas, and blood tests for HIV, syphilis, and herpes.   Health Maintenance, Male Adopting a healthy lifestyle and getting preventive care are important in promoting health and wellness. Ask your health care provider about: The right schedule for you to have regular tests and exams. Things you can do on your own to prevent diseases and keep yourself healthy. What should I know about diet, weight, and exercise? Eat a healthy diet  Eat a diet that includes plenty of vegetables, fruits, low-fat dairy products, and lean protein. Do not eat a lot of foods that are high in solid fats, added sugars, or sodium. Maintain a healthy weight Body mass index (BMI) is a measurement that can be used to identify possible weight problems. It estimates body fat based on height and weight. Your health care provider can help determine your BMI and help you achieve or maintain a healthy weight. Get regular exercise Get regular exercise. This is one of the most important things you can do for your health. Most adults should: Exercise for at least 150 minutes each week. The exercise should increase your heart rate and make you sweat (moderate-intensity exercise). Do strengthening exercises at least twice a week. This is in addition to the moderate-intensity exercise. Spend less time sitting. Even light physical activity  can be beneficial. Watch cholesterol and blood lipids Have your blood tested for lipids and cholesterol at 24 years of age, then have this test every 5 years. You may need to have your cholesterol levels checked more often if: Your lipid or cholesterol levels are high. You are older than 24 years of age. You are at high risk for heart disease. What should I know about cancer screening? Many types of cancers can be detected early and may often be prevented. Depending on your health history and family history, you may need to have cancer screening at various ages. This may include screening for: Colorectal cancer. Prostate cancer. Skin cancer. Lung cancer. What should I know about heart disease, diabetes, and high blood pressure? Blood pressure and heart disease High blood pressure causes heart disease and increases the risk of stroke. This is more likely to develop in people who have high blood pressure readings or are overweight. Talk with your health care provider about your target blood pressure readings. Have your blood pressure checked: Every 3-5 years if you are 34-39 years of age. Every year if you are 29 years old or older. If you are between the ages of 57 and 50 and are a current or former smoker, ask your health care provider if you should have a one-time screening for abdominal aortic aneurysm (AAA). Diabetes Have regular diabetes screenings. This checks your fasting blood sugar level. Have the screening done: Once every three years after age 53 if you are at a normal weight and have a low risk for diabetes. More often and at a younger age if you are overweight  or have a high risk for diabetes. What should I know about preventing infection? Hepatitis B If you have a higher risk for hepatitis B, you should be screened for this virus. Talk with your health care provider to find out if you are at risk for hepatitis B infection. Hepatitis C Blood testing is recommended  for: Everyone born from [birth year] through 1966. Anyone with known risk factors for hepatitis C. Sexually transmitted infections (STIs) You should be screened each year for STIs, including gonorrhea and chlamydia, if: You are sexually active and are younger than 24 years of age. You are older than 24 years of age and your health care provider tells you that you are at risk for this type of infection. Your sexual activity has changed since you were last screened, and you are at increased risk for chlamydia or gonorrhea. Ask your health care provider if you are at risk. Ask your health care provider about whether you are at high risk for HIV. Your health care provider may recommend a prescription medicine to help prevent HIV infection. If you choose to take medicine to prevent HIV, you should first get tested for HIV. You should then be tested every 3 months for as long as you are taking the medicine. Follow these instructions at home: Alcohol use Do not drink alcohol if your health care provider tells you not to drink. If you drink alcohol: Limit how much you have to 0-2 drinks a day. Know how much alcohol is in your drink. In the [place] one drink equals one 12 oz bottle of beer (355 mL), one 5 oz glass of wine (148 mL), or one 1 oz glass of hard liquor (44 mL). Lifestyle Do not use any products that contain nicotine or tobacco. These products include cigarettes, chewing tobacco, and vaping devices, such as e-cigarettes. If you need help quitting, ask your health care provider. Do not use street drugs. Do not share needles. Ask your health care provider for help if you need support or information about quitting drugs. General instructions Schedule regular health, dental, and eye exams. Stay current with your vaccines. Tell your health care provider if: You often feel depressed. You have ever been abused or do not feel safe at home. Summary Adopting a healthy lifestyle and getting preventive care  are important in promoting health and wellness. Follow your health care provider's instructions about healthy diet, exercising, and getting tested or screened for diseases. Follow your health care provider's instructions on monitoring your cholesterol and blood pressure. This information is not intended to replace advice given to you by your health care provider. Make sure you discuss any questions you have with your health care provider. Document Revised: 11/09/2020 Document Reviewed: 11/09/2020 Elsevier Patient Education  2024 [employer].

## 2024-04-04 LAB — URINE CYTOLOGY ANCILLARY ONLY
Chlamydia: NEGATIVE
Comment: NEGATIVE
Comment: NEGATIVE
Comment: NORMAL
Neisseria Gonorrhea: NEGATIVE
Trichomonas: NEGATIVE

## 2024-04-08 ENCOUNTER — Encounter: Payer: Self-pay | Admitting: Physician Assistant

## 2024-04-08 ENCOUNTER — Encounter (INDEPENDENT_AMBULATORY_CARE_PROVIDER_SITE_OTHER): Admitting: Primary Care

## 2024-04-08 ENCOUNTER — Ambulatory Visit: Payer: Self-pay | Admitting: Physician Assistant

## 2024-04-09 LAB — HIV ANTIBODY (ROUTINE TESTING W REFLEX): HIV Screen 4th Generation wRfx: NONREACTIVE

## 2024-04-09 LAB — HSV 1 AND 2 AB, IGG
HSV 1 Glycoprotein G Ab, IgG: NONREACTIVE
HSV 2 IgG, Type Spec: NONREACTIVE

## 2024-04-09 LAB — RPR QUALITATIVE: RPR Ser Ql: NONREACTIVE

## 2024-04-09 LAB — SPECIMEN STATUS REPORT

## 2024-04-11 ENCOUNTER — Ambulatory Visit: Payer: Self-pay | Admitting: Physician Assistant

## 2024-04-15 NOTE — Progress Notes (Signed)
 Attempted to contact pt no answer, LMOM

## 2024-04-18 NOTE — Progress Notes (Signed)
 Name and DOB verified. Test results reviewed with pt. He stated understanding

## 2024-04-22 ENCOUNTER — Encounter (INDEPENDENT_AMBULATORY_CARE_PROVIDER_SITE_OTHER): Payer: Self-pay | Admitting: Primary Care

## 2024-04-22 ENCOUNTER — Ambulatory Visit (INDEPENDENT_AMBULATORY_CARE_PROVIDER_SITE_OTHER): Admitting: Primary Care

## 2024-04-22 ENCOUNTER — Other Ambulatory Visit (HOSPITAL_COMMUNITY)
Admission: RE | Admit: 2024-04-22 | Discharge: 2024-04-22 | Disposition: A | Discharge status: Home / Self Care | Source: Ambulatory Visit | Attending: Primary Care | Admitting: Primary Care

## 2024-04-22 VITALS — BP 112/67 | HR 67 | Resp 16 | Ht 74.0 in | Wt 177.2 lb

## 2024-04-22 DIAGNOSIS — E7800 Pure hypercholesterolemia, unspecified: Secondary | ICD-10-CM | POA: Diagnosis not present

## 2024-04-22 DIAGNOSIS — Z0000 Encounter for general adult medical examination without abnormal findings: Secondary | ICD-10-CM | POA: Insufficient documentation

## 2024-04-22 DIAGNOSIS — R7303 Prediabetes: Secondary | ICD-10-CM | POA: Diagnosis not present

## 2024-04-22 DIAGNOSIS — Z113 Encounter for screening for infections with a predominantly sexual mode of transmission: Secondary | ICD-10-CM

## 2024-04-22 NOTE — Progress Notes (Signed)
 BP 112/67   Pulse 67   Resp 16   Ht 6' 2 (1.88 m)   Wt 177 lb 3.2 oz (80.4 kg)   SpO2 97%   BMI 22.75 kg/m    Subjective:    Patient ID: Kenneth Russo, male    DOB: 2000/07/28, 23 y.o.   MRN: [id number]  HPI: Kenneth Russo is a 23 y.o. male presenting on 04/22/2024 for comprehensive medical examination.  Patient voices concerns about his right ring finger being crook itwe will send in a picture via MyChart  He currently lives with:parents   Depression Screen done today and results listed below:     04/22/2024    2:59 PM 03/03/2023   11:22 AM 07/17/2019    2:06 PM 11/15/2017    1:36 PM 01/09/2017    2:40 PM  Depression screen PHQ 2/9  Decreased Interest 0 0 1 0 0  Down, Depressed, Hopeless 0 0 3 0 0  PHQ - 2 Score 0 0 4 0 0  Altered sleeping   0 0 0  Tired, decreased energy   1 0 0  Change in appetite   1 0 0  Feeling bad or failure about yourself    0 0 0  Trouble concentrating   1 0 0  Moving slowly or fidgety/restless   0 0 0  Suicidal thoughts   0 0 0  PHQ-9 Score   7 0 0  Difficult doing work/chores   Somewhat difficult Not difficult at all     Past Medical History:  Past Medical History:  Diagnosis Date   Healthy adolescent     Surgical History:  Past Surgical History:  Procedure Laterality Date   NO PAST SURGERIES      Medications:  No current outpatient medications on file prior to visit.   No current facility-administered medications on file prior to visit.    Allergies:  No Known Allergies  Social History:  Social History   Socioeconomic History   Marital status: Single    Spouse name: Not on file   Number of children: Not on file   Years of education: Not on file   Highest education level: Not on file  Occupational History   Not on file  Tobacco Use   Smoking status: Never   Smokeless tobacco: Never  Vaping Use   Vaping status: Never Used  Substance and Sexual Activity   Alcohol use: No   Drug use: No   Sexual activity: Yes     Partners: Female    Birth control/protection: Condom  Other Topics Concern   Not on file  Social History Narrative   ** Merged History Encounter **       Social Drivers of Corporate investment banker Strain: Not on file  Food Insecurity: Not on file  Transportation Needs: Not on file  Physical Activity: Not on file  Stress: Not on file  Social Connections: Not on file  Intimate Partner Violence: Not on file   Social History   Tobacco Use  Smoking Status Never  Smokeless Tobacco Never   Social History   Substance and Sexual Activity  Alcohol Use No    Family History:  Family History  Problem Relation Age of Onset   Hypertension Maternal Grandmother    Hypertension Maternal Grandfather    Cancer Paternal Grandfather        Lung   Stroke Paternal Grandfather     Past medical history, surgical history, medications,  allergies, family history and social history reviewed with patient today and changes made to appropriate areas of the chart.   Review of Systems - Negative except finger  All other ROS negative except what is listed above and in the HPI.      Objective:    BP 112/67   Pulse 67   Resp 16   Ht 6' 2 (1.88 m)   Wt 177 lb 3.2 oz (80.4 kg)   SpO2 97%   BMI 22.75 kg/m   Wt Readings from Last 3 Encounters:  04/22/24 177 lb 3.2 oz (80.4 kg)  04/02/24 185 lb (83.9 kg)  03/03/23 175 lb 9.6 oz (79.7 kg)    Physical Exam Vitals reviewed.  Constitutional:      Appearance: Normal appearance. He is normal weight.  HENT:     Head: Normocephalic.     Right Ear: Tympanic membrane and external ear normal.     Left Ear: Tympanic membrane and external ear normal.     Nose: Nose normal.  Eyes:     Extraocular Movements: Extraocular movements intact.     Pupils: Pupils are equal, round, and reactive to light.  Cardiovascular:     Rate and Rhythm: Normal rate and regular rhythm.  Pulmonary:     Effort: Pulmonary effort is normal.     Breath sounds:  Normal breath sounds.  Abdominal:     General: Bowel sounds are normal. There is distension.     Palpations: Abdomen is soft.  Musculoskeletal:        General: Normal range of motion.  Skin:    General: Skin is warm and dry.  Neurological:     Mental Status: He is alert and oriented to person, place, and time.  Psychiatric:        Mood and Affect: Mood normal.        Behavior: Behavior normal.        Thought Content: Thought content normal.        Judgment: Judgment normal.     Results for orders placed or performed in visit on 04/02/24  HSV 1 and 2 Ab, IgG   Collection Time: 04/02/24 12:00 AM  Result Value Ref Range   HSV 1 Glycoprotein G Ab, IgG Non Reactive Non Reactive   HSV 2 IgG, Type Spec Non Reactive Non Reactive  HIV Antibody (routine testing w rflx)   Collection Time: 04/02/24 12:00 AM  Result Value Ref Range   HIV Screen 4th Generation wRfx Non Reactive Non Reactive  RPR Qual   Collection Time: 04/02/24 12:00 AM  Result Value Ref Range   RPR Ser Ql Non Reactive Non Reactive  Specimen status report   Collection Time: 04/02/24 12:00 AM  Result Value Ref Range   specimen status report Comment       Assessment & Plan:  Doren was seen today for annual exam.  Diagnoses and all orders for this visit:  Annual physical exam -     CBC with Differential/Platelet -     CMP14+EGFR -     Hemoglobin A1c -     Lipid panel -     Urine cytology ancillary only  Screening examination for STD (sexually transmitted disease) -     Urine cytology ancillary only  Prediabetes -     CMP14+EGFR -     Hemoglobin A1c  Elevated LDL cholesterol level -     Lipid panel     Follow up plan:   IMMUNIZATIONS:   -  Tdap: Tetanus vaccination status reviewed: last tetanus booster within 10 years. - Influenza: Refused  PATIENT COUNSELING:   Sexuality: Discussed sexually transmitted diseases, partner selection, use of condoms, avoidance of unintended pregnancy  and  contraceptive alternatives.   Advised to avoid cigarette smoking.  I discussed with the patient that most people either abstain from alcohol or drink within safe limits (<=14/week and <=4 drinks/occasion for males, <=7/weeks and <= 3 drinks/occasion for females) and that the risk for alcohol disorders and other health effects rises proportionally with the number of drinks per week and how often a drinker exceeds daily limits.  Discussed cessation/primary prevention of drug use and availability of treatment for abuse.   Diet: Encouraged to adjust caloric intake to maintain  or achieve ideal body weight, to reduce intake of dietary saturated fat and total fat, to limit sodium intake by avoiding high sodium foods and not adding table salt, and to maintain adequate dietary potassium and calcium preferably from fresh fruits, vegetables, and low-fat dairy products.    stressed the importance of regular exercise  Injury prevention: Discussed safety belts, safety helmets, smoke detector, smoking near bedding or upholstery.   Dental health: Discussed importance of regular tooth brushing, flossing, and dental visits.    NEXT PREVENTATIVE PHYSICAL DUE IN 1 YEAR. Return for annual physical.

## 2024-04-23 LAB — CMP14+EGFR
ALT: 44 IU/L (ref 0–44)
AST: 57 IU/L — ABNORMAL HIGH (ref 0–40)
Albumin: 4.7 g/dL (ref 4.3–5.2)
Alkaline Phosphatase: 61 IU/L (ref 47–123)
BUN/Creatinine Ratio: 13 (ref 9–20)
BUN: 13 mg/dL (ref 6–20)
Bilirubin Total: 0.7 mg/dL (ref 0.0–1.2)
CO2: 25 mmol/L (ref 20–29)
Calcium: 10.1 mg/dL (ref 8.7–10.2)
Chloride: 102 mmol/L (ref 96–106)
Creatinine, Ser: 0.98 mg/dL (ref 0.76–1.27)
Globulin, Total: 2.4 g/dL (ref 1.5–4.5)
Glucose: 72 mg/dL (ref 70–99)
Potassium: 5 mmol/L (ref 3.5–5.2)
Sodium: 142 mmol/L (ref 134–144)
Total Protein: 7.1 g/dL (ref 6.0–8.5)
eGFR: 110 mL/min/1.73 (ref >59)

## 2024-04-23 LAB — CBC WITH DIFFERENTIAL/PLATELET
Basophils Absolute: 0 x10E3/uL (ref 0.0–0.2)
Basos: 1 %
EOS (ABSOLUTE): 0.1 x10E3/uL (ref 0.0–0.4)
Eos: 2 %
Hematocrit: 45.9 % (ref 37.5–51.0)
Hemoglobin: 14.8 g/dL (ref 13.0–17.7)
Immature Grans (Abs): 0 x10E3/uL (ref 0.0–0.1)
Immature Granulocytes: 0 %
Lymphocytes Absolute: 1.8 x10E3/uL (ref 0.7–3.1)
Lymphs: 43 %
MCH: 28.2 pg (ref 26.6–33.0)
MCHC: 32.2 g/dL (ref 31.5–35.7)
MCV: 87 fL (ref 79–97)
Monocytes Absolute: 0.4 x10E3/uL (ref 0.1–0.9)
Monocytes: 10 %
Neutrophils Absolute: 1.8 x10E3/uL (ref 1.4–7.0)
Neutrophils: 44 %
Platelets: 218 x10E3/uL (ref 150–450)
RBC: 5.25 x10E6/uL (ref 4.14–5.80)
RDW: 13 % (ref 11.6–15.4)
WBC: 4.1 x10E3/uL (ref 3.4–10.8)

## 2024-04-23 LAB — LIPID PANEL
Chol/HDL Ratio: 2.7 ratio (ref 0.0–5.0)
Cholesterol, Total: 146 mg/dL (ref 100–199)
HDL: 55 mg/dL (ref >39)
LDL Chol Calc (NIH): 82 mg/dL (ref 0–99)
Triglycerides: 37 mg/dL (ref 0–149)
VLDL Cholesterol Cal: 9 mg/dL (ref 5–40)

## 2024-04-23 LAB — HEMOGLOBIN A1C
Est. average glucose Bld gHb Est-mCnc: 108 mg/dL
Hgb A1c MFr Bld: 5.4 % (ref 4.8–5.6)

## 2024-04-25 LAB — URINE CYTOLOGY ANCILLARY ONLY
Chlamydia: NEGATIVE
Comment: NEGATIVE
Comment: NEGATIVE
Comment: NORMAL
Neisseria Gonorrhea: NEGATIVE
Trichomonas: NEGATIVE

## 2024-04-29 ENCOUNTER — Ambulatory Visit (INDEPENDENT_AMBULATORY_CARE_PROVIDER_SITE_OTHER): Payer: Self-pay | Admitting: Primary Care
